# Patient Record
Sex: Male | Born: 1975 | Race: White | Hispanic: No | Marital: Married | Smoking: Never smoker
Health system: Southern US, Community
[De-identification: ages and names within clinical notes are randomized; demographics above are authoritative.]

## PROBLEM LIST (undated history)

## (undated) ENCOUNTER — Ambulatory Visit: Discharge status: Absent without leave | Payer: 59

## (undated) DIAGNOSIS — I2510 Atherosclerotic heart disease of native coronary artery without angina pectoris: Secondary | ICD-10-CM

## (undated) HISTORY — DX: Atherosclerotic heart disease of native coronary artery without angina pectoris: I25.10

---

## 2010-06-27 ENCOUNTER — Other Ambulatory Visit (HOSPITAL_COMMUNITY)
Admission: RE | Admit: 2010-06-27 | Discharge: 2010-06-27 | Disposition: A | Discharge status: Home / Self Care | Payer: BC Managed Care – PPO | Source: Ambulatory Visit | Attending: Urology | Admitting: Urology

## 2010-06-27 ENCOUNTER — Other Ambulatory Visit: Payer: Self-pay | Admitting: Urology

## 2010-06-27 DIAGNOSIS — Z302 Encounter for sterilization: Secondary | ICD-10-CM | POA: Insufficient documentation

## 2012-10-27 ENCOUNTER — Other Ambulatory Visit (HOSPITAL_COMMUNITY): Payer: Self-pay | Admitting: Urology

## 2012-10-27 DIAGNOSIS — N451 Epididymitis: Secondary | ICD-10-CM

## 2012-11-02 ENCOUNTER — Other Ambulatory Visit (HOSPITAL_COMMUNITY): Payer: BC Managed Care – PPO

## 2013-01-26 ENCOUNTER — Other Ambulatory Visit: Payer: Self-pay | Admitting: Occupational Medicine

## 2013-01-26 ENCOUNTER — Ambulatory Visit
Admission: RE | Admit: 2013-01-26 | Discharge: 2013-01-26 | Disposition: A | Discharge status: Home / Self Care | Payer: No Typology Code available for payment source | Source: Ambulatory Visit | Attending: Occupational Medicine | Admitting: Occupational Medicine

## 2013-01-26 DIAGNOSIS — Z021 Encounter for pre-employment examination: Secondary | ICD-10-CM

## 2016-11-12 ENCOUNTER — Ambulatory Visit (INDEPENDENT_AMBULATORY_CARE_PROVIDER_SITE_OTHER): Payer: 59

## 2016-11-12 ENCOUNTER — Ambulatory Visit (INDEPENDENT_AMBULATORY_CARE_PROVIDER_SITE_OTHER): Payer: 59 | Admitting: Orthopaedic Surgery

## 2016-11-12 ENCOUNTER — Encounter: Payer: Self-pay | Admitting: Orthopaedic Surgery

## 2016-11-12 VITALS — BP 126/92 | HR 73 | Temp 97.2°F | Ht 72.0 in | Wt 239.0 lb

## 2016-11-12 DIAGNOSIS — M25571 Pain in right ankle and joints of right foot: Secondary | ICD-10-CM

## 2016-11-12 MED ORDER — NAPROXEN 500 MG PO TABS: 500.0000 mg | ORAL_TABLET | Freq: Two times a day (BID) | ORAL | 5 refills | Status: DC

## 2016-11-12 NOTE — Progress Notes (Signed)
Subjective:    Patient ID: Bernard Lloyd, male    DOB: [birth year], 41 y.o.   MRN: [id number]  HPI He took a misstep and hurt his right mid foot around the medial arch about a month ago.  He iced it and stayed off it a few days.  It continues to bother him.  His wife is a physical therapist and he has tried some stretching, ice, used a ball around the foot and a night splint.  He could not tolerate the night splint.  He has no new trauma. 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  He is able to do his work but his foot hurts and bothers him after work.  It hurts when he first gets up but he gets better after walking on it.    He has no redness, no swelling, no color changes.  He has tried Advil which helps some.  He has no other joint pains.   Review of Systems  HENT: Negative for congestion.   Respiratory: Negative for cough and choking.   Cardiovascular: Negative for chest pain and leg swelling.  Endocrine: Negative for cold intolerance.  Musculoskeletal: Positive for arthralgias and gait problem.  Allergic/Immunologic: Negative for environmental allergies.   History reviewed. No pertinent past medical history.  History reviewed. No pertinent surgical history.  No current outpatient prescriptions on file prior to visit.   No current facility-administered medications on file prior to visit.     Social History   Social History  . Marital status: Married    Spouse name: N/A  . Number of children: N/A  . Years of education: N/A   Occupational History  . Not on file.   Social History Main Topics  . Smoking status: Never Smoker  . Smokeless tobacco: Never Used  . Alcohol use No  . Drug use: No  . Sexual activity: Not on file   Other Topics Concern  . Not on file   Social History Narrative  . No narrative on file    Family History  Problem Relation Age of Onset  . Diabetes Mother     BP (!) 126/92   Pulse 73   Temp (!) 97.2 F (36.2 C)   Ht 6' (1.829 m)   Wt 239  lb (108.4 kg)   BMI 32.41 kg/m       Objective:   Physical Exam  Constitutional: He is oriented to person, place, and time. He appears well-developed and well-nourished.  HENT:  Head: Normocephalic and atraumatic.  Eyes: Pupils are equal, round, and reactive to light. Conjunctivae and EOM are normal.  Neck: Normal range of motion. Neck supple.  Cardiovascular: Normal rate, regular rhythm and intact distal pulses.   Pulmonary/Chest: Effort normal.  Abdominal: Soft.  Musculoskeletal: He exhibits tenderness (Right foot inner arch has tenderness in the mid portion.  He has no swelling. He has slight pain with full dorsiextension.  He has full ROM of the toes and ankle.  Gait is normal.  NV intact.  Left foot/ankle negative.).  Neurological: He is alert and oriented to person, place, and time. He has normal reflexes. No cranial nerve deficit. He exhibits normal muscle tone. Coordination normal.  Skin: Skin is warm and dry.  Psychiatric: He has a normal mood and affect. His behavior is normal. Judgment and thought content normal.  Vitals reviewed.   X-rays were done of the left foot and ankle, reported separately.      Assessment & Plan:   Encounter  Diagnosis  Name Primary?  . Pain in joint involving right ankle and foot Yes   I have tole him x-rays were negative.  I have gone over stretching exercises.  I have recommended BioFreeze.  I have called in Naprosyn to his pharmacy.  Precautions discussed.  Return in one month.  Call if any problem.  Precautions discussed.   Electronically Signed Sanjuana Kava, MD 8/8/201811:17 AM

## 2016-12-11 ENCOUNTER — Encounter: Payer: Self-pay | Admitting: Orthopaedic Surgery

## 2016-12-11 ENCOUNTER — Ambulatory Visit (INDEPENDENT_AMBULATORY_CARE_PROVIDER_SITE_OTHER): Payer: 59 | Admitting: Orthopaedic Surgery

## 2016-12-11 VITALS — BP 115/83 | HR 70 | Temp 96.8°F | Ht 72.0 in | Wt 239.0 lb

## 2016-12-11 DIAGNOSIS — M25571 Pain in right ankle and joints of right foot: Secondary | ICD-10-CM | POA: Diagnosis not present

## 2016-12-11 NOTE — Progress Notes (Signed)
Patient Bernard Lloyd, male DOB:April 22, 1975, 41 y.o. WKG:[id number]  Chief Complaint  Patient presents with  . Foot Pain    right    HPI  Bernard Lloyd is a 41 y.o. male who has had pain in the right medial instep of the foot.  He has less pain now.  He has special inserts for his shoe that have helped.  He is walking well. HPI  Body mass index is 32.41 kg/m.  ROS  Review of Systems  HENT: Negative for congestion.   Respiratory: Negative for cough and choking.   Cardiovascular: Negative for chest pain and leg swelling.  Endocrine: Negative for cold intolerance.  Musculoskeletal: Positive for arthralgias and gait problem.  Allergic/Immunologic: Negative for environmental allergies.    No past medical history on file.  No past surgical history on file.  Family History  Problem Relation Age of Onset  . Diabetes Mother     Social History Social History  Substance Use Topics  . Smoking status: Never Smoker  . Smokeless tobacco: Never Used  . Alcohol use No    No Known Allergies  Current Outpatient Prescriptions  Medication Sig Dispense Refill  . naproxen (NAPROSYN) 500 MG tablet Take 1 tablet (500 mg total) by mouth 2 (two) times daily with a meal. 60 tablet 5   No current facility-administered medications for this visit.      Physical Exam  Blood pressure 115/83, pulse 70, temperature (!) 96.8 F (36 C), height 6' (1.829 m), weight 239 lb (108.4 kg).  Constitutional: overall normal hygiene, normal nutrition, well developed, normal grooming, normal body habitus. Assistive device:insert shoes  Musculoskeletal: gait and station Limp none, muscle tone and strength are normal, no tremors or atrophy is present.  .  Neurological: coordination overall normal.  Deep tendon reflex/nerve stretch intact.  Sensation normal.  Cranial nerves II-XII intact.   Skin:   Normal overall no scars, lesions, ulcers or rashes. No psoriasis.  Psychiatric: Alert and oriented x 3.   Recent memory intact, remote memory unclear.  Normal mood and affect. Well groomed.  Good eye contact.  Cardiovascular: overall no swelling, no varicosities, no edema bilaterally, normal temperatures of the legs and arms, no clubbing, cyanosis and good capillary refill.  Lymphatic: palpation is normal.  The right foot is not tender.  He is walking well.  Exam negative.  The patient has been educated about the nature of the problem(s) and counseled on treatment options.  The patient appeared to understand what I have discussed and is in agreement with it.  Encounter Diagnosis  Name Primary?  . Pain in joint involving right ankle and foot Yes    PLAN Call if any problems.  Precautions discussed.  Continue current medications.   Return to clinic PRN   Electronically Signed Sanjuana Kava, MD 9/6/201810:42 AM

## 2018-02-17 LAB — GLUCOSE, POCT (MANUAL RESULT ENTRY): POC GLUCOSE: 107 mg/dL — AB (ref 70–99)

## 2020-03-02 ENCOUNTER — Ambulatory Visit
Admission: RE | Admit: 2020-03-02 | Discharge: 2020-03-02 | Disposition: A | Discharge status: Home / Self Care | Payer: 59 | Source: Ambulatory Visit | Attending: Family Medicine | Admitting: Family Medicine

## 2020-03-02 ENCOUNTER — Other Ambulatory Visit: Payer: Self-pay

## 2020-03-02 VITALS — BP 140/99 | HR 104 | Temp 98.7°F | Resp 18

## 2020-03-02 DIAGNOSIS — R000 Tachycardia, unspecified: Secondary | ICD-10-CM

## 2020-03-02 DIAGNOSIS — J069 Acute upper respiratory infection, unspecified: Secondary | ICD-10-CM

## 2020-03-02 DIAGNOSIS — R519 Headache, unspecified: Secondary | ICD-10-CM | POA: Diagnosis present

## 2020-03-02 DIAGNOSIS — R6883 Chills (without fever): Secondary | ICD-10-CM | POA: Diagnosis not present

## 2020-03-02 DIAGNOSIS — K122 Cellulitis and abscess of mouth: Secondary | ICD-10-CM

## 2020-03-02 DIAGNOSIS — J029 Acute pharyngitis, unspecified: Secondary | ICD-10-CM | POA: Diagnosis not present

## 2020-03-02 LAB — POCT RAPID STREP A (OFFICE): Rapid Strep A Screen: NEGATIVE

## 2020-03-02 MED ORDER — AZITHROMYCIN 250 MG PO TABS: 250.0000 mg | ORAL_TABLET | Freq: Every day | ORAL | 0 refills | Status: DC

## 2020-03-02 MED ORDER — PREDNISONE 10 MG (21) PO TBPK: ORAL_TABLET | Freq: Every day | ORAL | 0 refills | Status: AC

## 2020-03-02 NOTE — ED Triage Notes (Addendum)
Pt is here with a swollen tonsilis that started 2 days ago, pt has taken OTC meds to relieve discomfort.

## 2020-03-02 NOTE — ED Provider Notes (Signed)
[facility]   [id number] 03/02/20 Arrival Time: 8280  [id number] THROAT  SUBJECTIVE: History from: patient.  Estle Huguley is a 44 y.o. male who presents with abrupt onset of sore throat, sinus pain, lymphadenopathy, fatigue, for the last 2 days. 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 Has tried OTC cough and cold without relief. Has negative history of Covid. Has completed Covid vaccines. Had negative Covid test yesterday. Symptoms are made worse with swallowing, but tolerating liquids and own secretions without difficulty.  Denies previous symptoms in the past. Declines Covid testing today.  Denies fever, chills, ear pain, rhinorrhea, SOB, wheezing, chest pain, nausea, rash, changes in bowel or bladder habits.    ROS: As per HPI.  All other pertinent ROS negative.     History reviewed. No pertinent past medical history. History reviewed. No pertinent surgical history. No Known Allergies No current facility-administered medications on file prior to encounter.   Current Outpatient Medications on File Prior to Encounter  Medication Sig Dispense Refill  . finasteride (PROPECIA) 1 MG tablet Take by mouth.    . phentermine (ADIPEX-P) 37.5 MG tablet TAKE 1 TABLET BY MOUTH 30 MINUTES BEFORE BREAKFAST    . rosuvastatin (CRESTOR) 10 MG tablet Take by mouth.    . valACYclovir (VALTREX) 1000 MG tablet Take one tablet daily as needed.    . naproxen (NAPROSYN) 500 MG tablet Take 1 tablet (500 mg total) by mouth 2 (two) times daily with a meal. 60 tablet 5   Social History   Socioeconomic History  . Marital status: Married    Spouse name: Not on file  . Number of children: Not on file  . Years of education: Not on file  . Highest education level: Not on file  Occupational History  . Not on file  Tobacco Use  . Smoking status: Never Smoker  . Smokeless tobacco: Never Used  Substance and Sexual Activity  . Alcohol use: Yes  . Drug use: No  . Sexual  activity: Yes    Birth control/protection: None    Comment: Married  Other Topics Concern  . Not on file  Social History Narrative  . Not on file   Social Determinants of Health   Financial Resource Strain:   . Difficulty of Paying Living Expenses: Not on file  Food Insecurity:   . Worried About Charity fundraiser in the Last Year: Not on file  . Ran Out of Food in the Last Year: Not on file  Transportation Needs:   . Lack of Transportation (Medical): Not on file  . Lack of Transportation (Non-Medical): Not on file  Physical Activity:   . Days of Exercise per Week: Not on file  . Minutes of Exercise per Session: Not on file  Stress:   . Feeling of Stress : Not on file  Social Connections:   . Frequency of Communication with Friends and Family: Not on file  . Frequency of Social Gatherings with Friends and Family: Not on file  . Attends Religious Services: Not on file  . Active Member of Clubs or Organizations: Not on file  . Attends Archivist Meetings: Not on file  . Marital Status: Not on file  Intimate Partner Violence:   . Fear of Current or Ex-Partner: Not on file  . Emotionally Abused: Not on file  . Physically Abused: Not on file  . Sexually Abused: Not on file   Family History  Problem Relation Age of  Onset  . Diabetes Mother   . Healthy Father     OBJECTIVE:  Vitals:   03/02/20 1021  BP: (!) 140/99  Pulse: (!) 104  Resp: 18  Temp: 98.7 F (37.1 C)  TempSrc: Oral  SpO2: 96%     General appearance: alert; appears fatigued, but nontoxic, speaking in full sentences and managing own secretions HEENT: NCAT; Ears: EACs clear, TMs pearly gray with visible cone of light, without erythema; Eyes: PERRL, EOMI grossly; Nose: no obvious rhinorrhea; Throat: oropharynx erythematous, cobblestoning present, tonsils 1+ and mildly erythematous without white tonsillar exudates, uvula midline, erythematous, enlarged, sinuses: Frontal and maxillary sinus  tenderness present Neck: supple with LAD Lungs: CTA; cough absent Heart: regular rate and rhythm.  Radial pulses 2+ symmetrical bilaterally Skin: warm and dry Psychological: alert and cooperative; normal mood and affect  LABS: No results found for this or any previous visit (from the past 24 hour(s)).   ASSESSMENT & PLAN:  1. Acute upper respiratory infection   2. Sore throat   3. Uvulitis   4. Chills   5. Tachycardia   6. Nonintractable headache, unspecified chronicity pattern, unspecified headache type     Meds ordered this encounter  Medications  . azithromycin (ZITHROMAX) 250 MG tablet    Sig: Take 1 tablet (250 mg total) by mouth daily. Take first 2 tablets together, then 1 every day until finished.    Dispense:  6 tablet    Refill:  0    Order Specific Question:   Supervising Provider    Answer:   Chase Picket [id number]  . predniSONE (STERAPRED UNI-PAK 21 TAB) 10 MG (21) TBPK tablet    Sig: Take by mouth daily for 6 days. Take 6 tablets on day 1, 5 tablets on day 2, 4 tablets on day 3, 3 tablets on day 4, 2 tablets on day 5, 1 tablet on day 6    Dispense:  21 tablet    Refill:  0    Order Specific Question:   Supervising Provider    Answer:   Chase Picket [[id number]]    We will treat for URI Prescribed azithromycin Prescribed steroid taper Strep test negative, will send out for culture and we will call you with results Declines test for mono at this time Get plenty of rest and push fluids Take OTC Zyrtec and use chloraseptic spray as needed for throat pain. Drink warm or cool liquids, use throat lozenges, or popsicles to help alleviate symptoms Take OTC ibuprofen or tylenol as needed for pain Follow up with PCP if symptoms persists Return or go to ER if patient has any new or worsening symptoms such as fever, chills, nausea, vomiting, worsening sore throat, cough, abdominal pain, chest pain, changes in bowel or bladder habits  Reviewed expectations re:  course of current medical issues. Questions answered. Outlined signs and symptoms indicating need for more acute intervention. Patient verbalized understanding. After Visit Summary given.          Faustino Congress, NP 03/04/20 1025

## 2020-03-02 NOTE — Discharge Instructions (Signed)
Your rapid strep test is negative.  A throat culture is pending; we will call you if it is positive requiring treatment.    I have sent in azithromycin for you to take. Take 2 tablets today, then one tablet daily for the next 4 days.  I have sent in a prednisone taper for you to take for 6 days. 6 tablets on day one, 5 tablets on day two, 4 tablets on day three, 3 tablets on day four, 2 tablets on day five, and 1 tablet on day six.  Follow up with this office or with primary care if symptoms are persisting.  Follow up in the ER for high fever, trouble swallowing, trouble breathing, other concerning symptoms.

## 2020-03-05 LAB — CULTURE, GROUP A STREP (THRC)

## 2020-07-25 LAB — EXTERNAL GENERIC LAB PROCEDURE: COLOGUARD: NEGATIVE

## 2020-07-25 LAB — COLOGUARD: COLOGUARD: NEGATIVE

## 2021-05-05 ENCOUNTER — Ambulatory Visit: Payer: 59

## 2021-05-05 ENCOUNTER — Ambulatory Visit
Admission: EM | Admit: 2021-05-05 | Discharge: 2021-05-05 | Disposition: A | Discharge status: Home / Self Care | Payer: 59 | Attending: Student | Admitting: Student

## 2021-05-05 ENCOUNTER — Ambulatory Visit (INDEPENDENT_AMBULATORY_CARE_PROVIDER_SITE_OTHER): Payer: 59

## 2021-05-05 ENCOUNTER — Other Ambulatory Visit: Payer: Self-pay

## 2021-05-05 DIAGNOSIS — R042 Hemoptysis: Secondary | ICD-10-CM | POA: Diagnosis not present

## 2021-05-05 DIAGNOSIS — J0100 Acute maxillary sinusitis, unspecified: Secondary | ICD-10-CM | POA: Diagnosis not present

## 2021-05-05 DIAGNOSIS — R059 Cough, unspecified: Secondary | ICD-10-CM

## 2021-05-05 DIAGNOSIS — U071 COVID-19: Secondary | ICD-10-CM | POA: Diagnosis not present

## 2021-05-05 MED ORDER — AMOXICILLIN-POT CLAVULANATE 875-125 MG PO TABS: 1.0000 | ORAL_TABLET | Freq: Two times a day (BID) | ORAL | 0 refills | Status: AC

## 2021-05-05 NOTE — ED Provider Notes (Addendum)
[facility]    CSN: [id number] Arrival date & time: 05/05/21  1638      History   Chief Complaint Chief Complaint  Patient presents with   sinus issues    Drainage and sinus issues    HPI Bernard Lloyd is a 46 y.o. male presenting with cough and  congestion following COVID-19 x10 days.  Medical history noncontributory, denies history of cardiopulmonary disease, he has never smoked.  States he was actually feeling better following a telehealth visit / paxlovid, but then started feeling bad again 2 days ago.  Is now coughing up blood and mucus and throat is sore.  Increasingly purulent nasal congestion and facial pain.  States that he is coughing up sputum that is yellow, and there is only trace blood in it.  Denies shortness of breath or chest pain.  Denies dizziness, weakness. Denies recent travel, prolonged immobilization, recent surgery, recent trauma, HRT use, history of clots, history of DVT, history of PE, smoking. Has not taken any medications today.   HPI  History reviewed. No pertinent past medical history.  There are no problems to display for this patient.   History reviewed. No pertinent surgical history.     Home Medications    Prior to Admission medications   Medication Sig Start Date End Date Taking? Authorizing Provider  amoxicillin-clavulanate (AUGMENTIN) 875-125 MG tablet Take 1 tablet by mouth every 12 (twelve) hours. 05/05/21  Yes Hazel Sams, PA-C  finasteride (PROPECIA) 1 MG tablet Take by mouth. 11/17/14   [provider]  phentermine (ADIPEX-P) 37.5 MG tablet TAKE 1 TABLET BY MOUTH 30 MINUTES BEFORE BREAKFAST 12/20/19   [provider]  rosuvastatin (CRESTOR) 10 MG tablet Take by mouth. 12/22/19   [provider]  valACYclovir (VALTREX) 1000 MG tablet Take one tablet daily as needed. 09/15/13   [provider]    Family History Family History  Problem Relation Age of Onset   Diabetes Mother     Healthy Father     Social History Social History   Tobacco Use   Smoking status: Never   Smokeless tobacco: Never  Vaping Use   Vaping Use: Never used  Substance Use Topics   Alcohol use: Yes    Comment: Occas   Drug use: No     Allergies   Patient has no known allergies.   Review of Systems Review of Systems  Constitutional:  Negative for appetite change, chills and fever.  HENT:  Positive for congestion and sinus pain. Negative for ear pain, rhinorrhea, sinus pressure and sore throat.   Eyes:  Negative for redness and visual disturbance.  Respiratory:  Positive for cough. Negative for chest tightness, shortness of breath and wheezing.   Cardiovascular:  Negative for chest pain and palpitations.  Gastrointestinal:  Negative for abdominal pain, constipation, diarrhea, nausea and vomiting.  Genitourinary:  Negative for dysuria, frequency and urgency.  Musculoskeletal:  Negative for myalgias.  Neurological:  Negative for dizziness, weakness and headaches.  Psychiatric/Behavioral:  Negative for confusion.   All other systems reviewed and are negative.   Physical Exam Triage Vital Signs ED Triage Vitals [05/05/21 0841]  Enc Vitals Group     BP      Pulse      Resp      Temp      Temp src      SpO2      Weight      Height      Head Circumference  Peak Flow      Pain Score 7     Pain Loc      Pain Edu?      Excl. in [place]?    No data found.  Updated Vital Signs BP 136/86 (BP Location: Right Arm)    Pulse (!) 118    Temp 99.2 F (37.3 C) (Oral)    Resp 18    SpO2 95%   Visual Acuity Right Eye Distance:   Left Eye Distance:   Bilateral Distance:    Right Eye Near:   Left Eye Near:    Bilateral Near:     Physical Exam Vitals reviewed.  Constitutional:      General: He is not in acute distress.    Appearance: Normal appearance. He is not ill-appearing.  HENT:     Head: Normocephalic and atraumatic.     Right Ear: Tympanic membrane, ear canal and  external ear normal. No tenderness. No middle ear effusion. There is no impacted cerumen. Tympanic membrane is not perforated, erythematous, retracted or bulging.     Left Ear: Tympanic membrane, ear canal and external ear normal. No tenderness.  No middle ear effusion. There is no impacted cerumen. Tympanic membrane is not perforated, erythematous, retracted or bulging.     Nose: No congestion.     Right Sinus: Maxillary sinus tenderness present. No frontal sinus tenderness.     Left Sinus: Maxillary sinus tenderness present. No frontal sinus tenderness.     Mouth/Throat:     Mouth: Mucous membranes are moist.     Pharynx: Uvula midline. Posterior oropharyngeal erythema present. No oropharyngeal exudate.     Comments: Posterior pharyngeal erythema and cobblestoning. No tonsillar or uvula hypertrophy.  Eyes:     Extraocular Movements: Extraocular movements intact.     Pupils: Pupils are equal, round, and reactive to light.  Cardiovascular:     Rate and Rhythm: Regular rhythm. Tachycardia present.     Heart sounds: Normal heart sounds.     Comments: Calves are equal and symmetric. Negative homan sign. No venous distension. Pulmonary:     Effort: Pulmonary effort is normal.     Breath sounds: Normal breath sounds. No decreased breath sounds, wheezing, rhonchi or rales.     Comments: Breath sounds clear throughout  Abdominal:     Palpations: Abdomen is soft.     Tenderness: There is no abdominal tenderness. There is no guarding or rebound.  Lymphadenopathy:     Cervical: No cervical adenopathy.     Right cervical: No superficial cervical adenopathy.    Left cervical: No superficial cervical adenopathy.  Neurological:     General: No focal deficit present.     Mental Status: He is alert and oriented to person, place, and time.  Psychiatric:        Mood and Affect: Mood normal.        Behavior: Behavior normal.        Thought Content: Thought content normal.        Judgment: Judgment  normal.     UC Treatments / Results  Labs (all labs ordered are listed, but only abnormal results are displayed) Labs Reviewed - No data to display  EKG   Radiology DG Chest 2 View  Result Date: 05/05/2021 CLINICAL DATA:  Hemoptysis and worsening cough EXAM: CHEST - 2 VIEW COMPARISON:  01/26/2013 FINDINGS: Normal heart size and mediastinal contours. No acute infiltrate or edema. No effusion or pneumothorax. No acute osseous findings. IMPRESSION: No active  cardiopulmonary disease. Electronically Signed   By: Jorje Guild M.D.   On: 05/05/2021 09:10    Procedures Procedures (including critical care time)  Medications Ordered in UC Medications - No data to display  Initial Impression / Assessment and Plan / UC Course  I have reviewed the triage vital signs and the nursing notes.  Pertinent labs & imaging results that were available during my care of the patient were reviewed by me and considered in my medical decision making (see chart for details).     This patient is a very pleasant 46 y.o. year old male presenting with sinusitis. He is tachy at 118 but afebrile.  There is trace hemoptysis. CXR - No active cardiopulmonary disease.Marland Kitchen He does not have a history of pulmonary disease.  Wells score for PE is 2.5.  He has never had a DVT or PE before, he is not on hormone replacement, never smoker.  There is only trace blood in his sputum.  Suspect this is related to sinusitis and postnasal drip rather than true hemoptysis; but cannot completely rule out PE at urgent care.  Discussed DDx with patient, he adamantly prefers antibiotic management versus ED referral.  States that he will go to the emergency department if symptoms get worse at all.  He understands that a PE is a medical emergency, and that I cannot completely rule this out in the urgent care setting, and then going home without this evaluation could be dangerous. ED return precautions discussed. Patient verbalizes understanding  and agreement. Coding Level 4 for acute illness with systemic symptoms, and prescription drug management.   Final Clinical Impressions(s) / UC Diagnoses   Final diagnoses:  Acute non-recurrent maxillary sinusitis  COVID-19  Hemoptysis     Discharge Instructions      -I think that you have a sinus infection that is causing the fast heart rate and blood in your sputum. -There is a small chance that you have something called a pulmonary embolism, this can also cause a fast heart rate and blood in your sputum.  If your symptoms get worse, like shortness of breath, chest pain, you are coughing up more blood-head straight to the emergency department.  This would be a medical emergency.     ED Prescriptions     Medication Sig Dispense Auth. Provider   amoxicillin-clavulanate (AUGMENTIN) 875-125 MG tablet Take 1 tablet by mouth every 12 (twelve) hours. 14 tablet Hazel Sams, PA-C      PDMP not reviewed this encounter.   Hazel Sams, PA-C 05/05/21 0935    Hazel Sams, PA-C 05/05/21 1756

## 2021-05-05 NOTE — ED Triage Notes (Signed)
Patient states about 10 days ago he tested positive for Covid  Patient states he did a Tele-Visit and they prescribed him some Meds and started feeling better  Patient states he started feeling bad again on Friday 2 days ago.  Patient states he is coughing up blood and mucus and his throat is sore  Patient states his lungs still feel full and his head is stuffed up   Patient states that his tonsils are swollen

## 2021-05-05 NOTE — Discharge Instructions (Addendum)
-  I think that you have a sinus infection that is causing the fast heart rate and blood in your sputum. -There is a small chance that you have something called a pulmonary embolism, this can also cause a fast heart rate and blood in your sputum.  If your symptoms get worse, like shortness of breath, chest pain, you are coughing up more blood-head straight to the emergency department.  This would be a medical emergency.

## 2022-09-24 IMAGING — DX DG CHEST 2V
2 series · 2 of 2 positions shown · non-contrast
Comparison: 01/26/2013

CLINICAL DATA: Hemoptysis and worsening cough

EXAM:
CHEST - 2 VIEW

[chest pa]
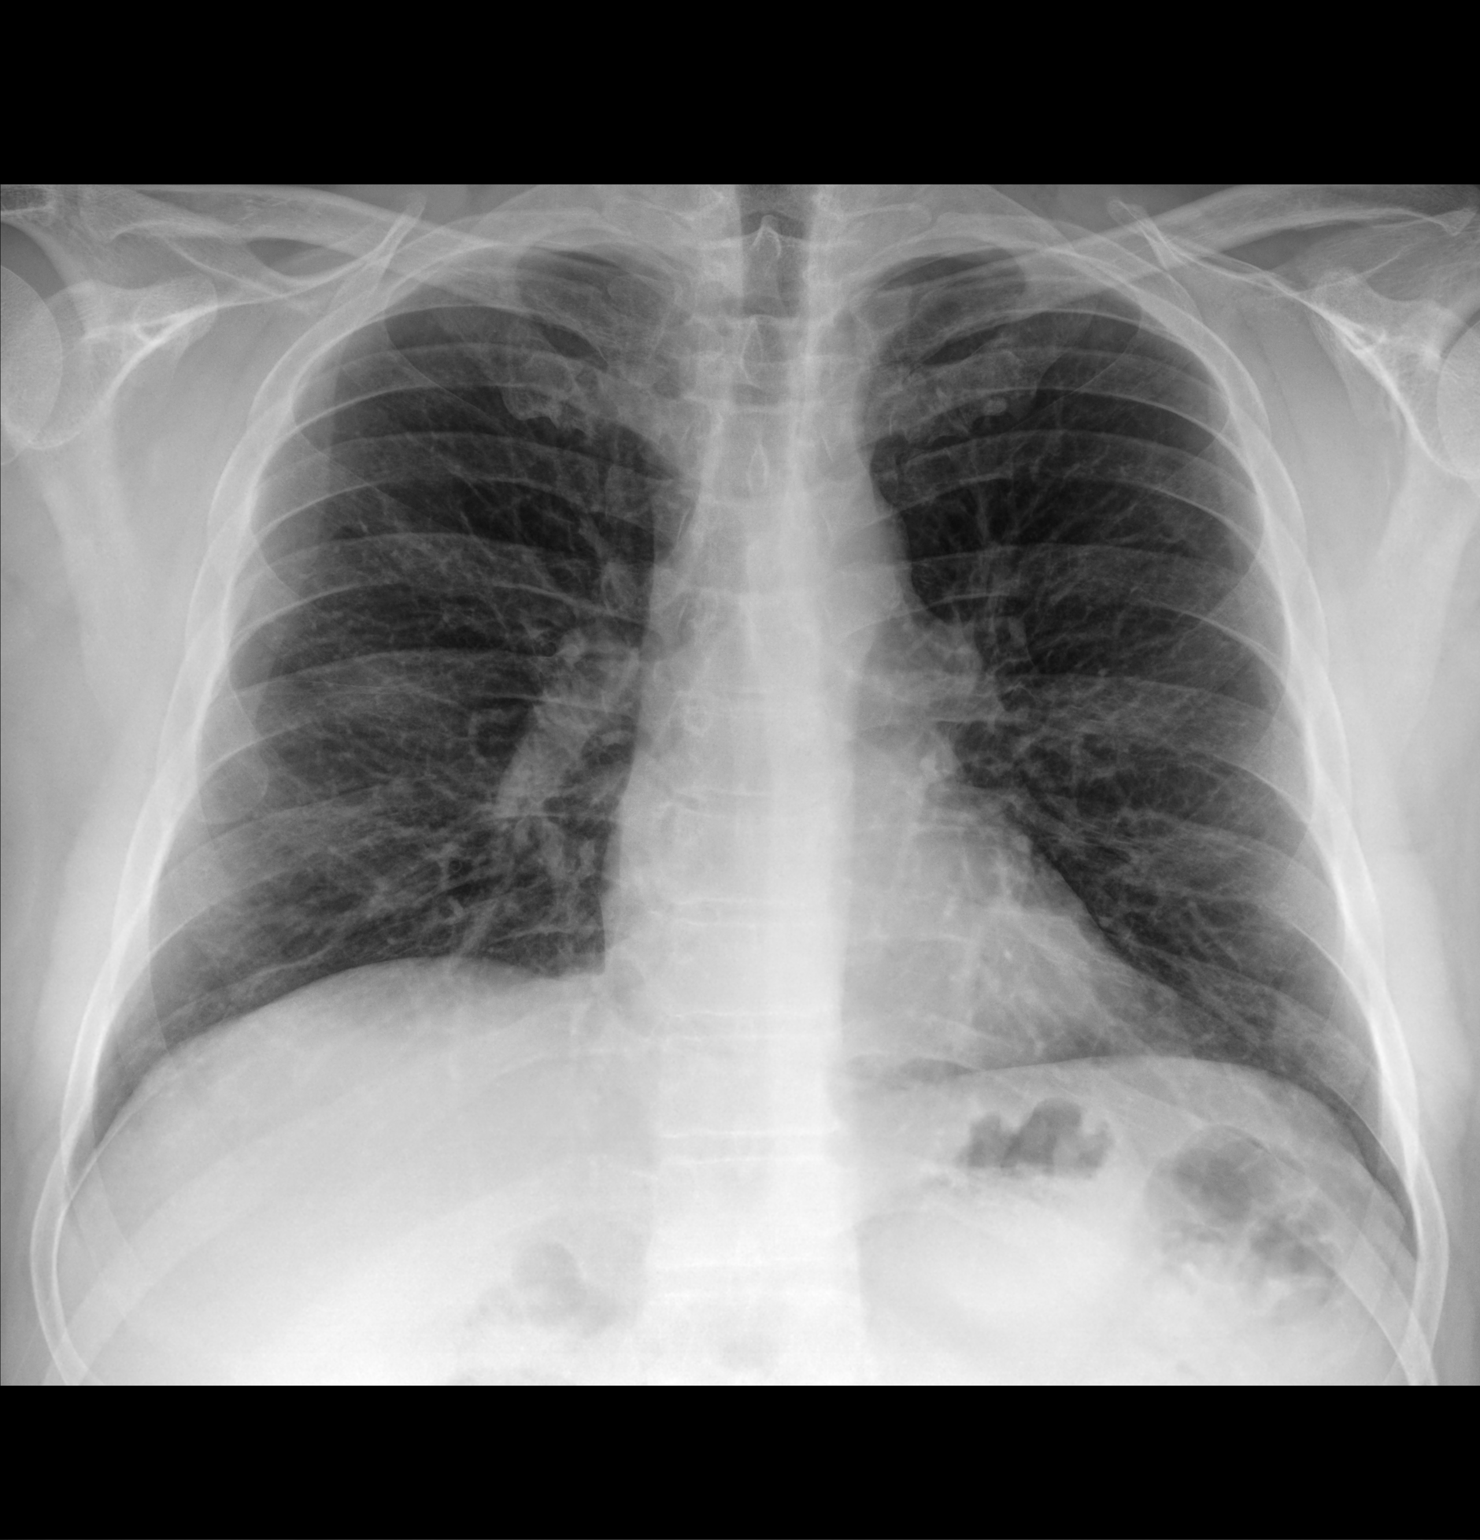

[chest lat]
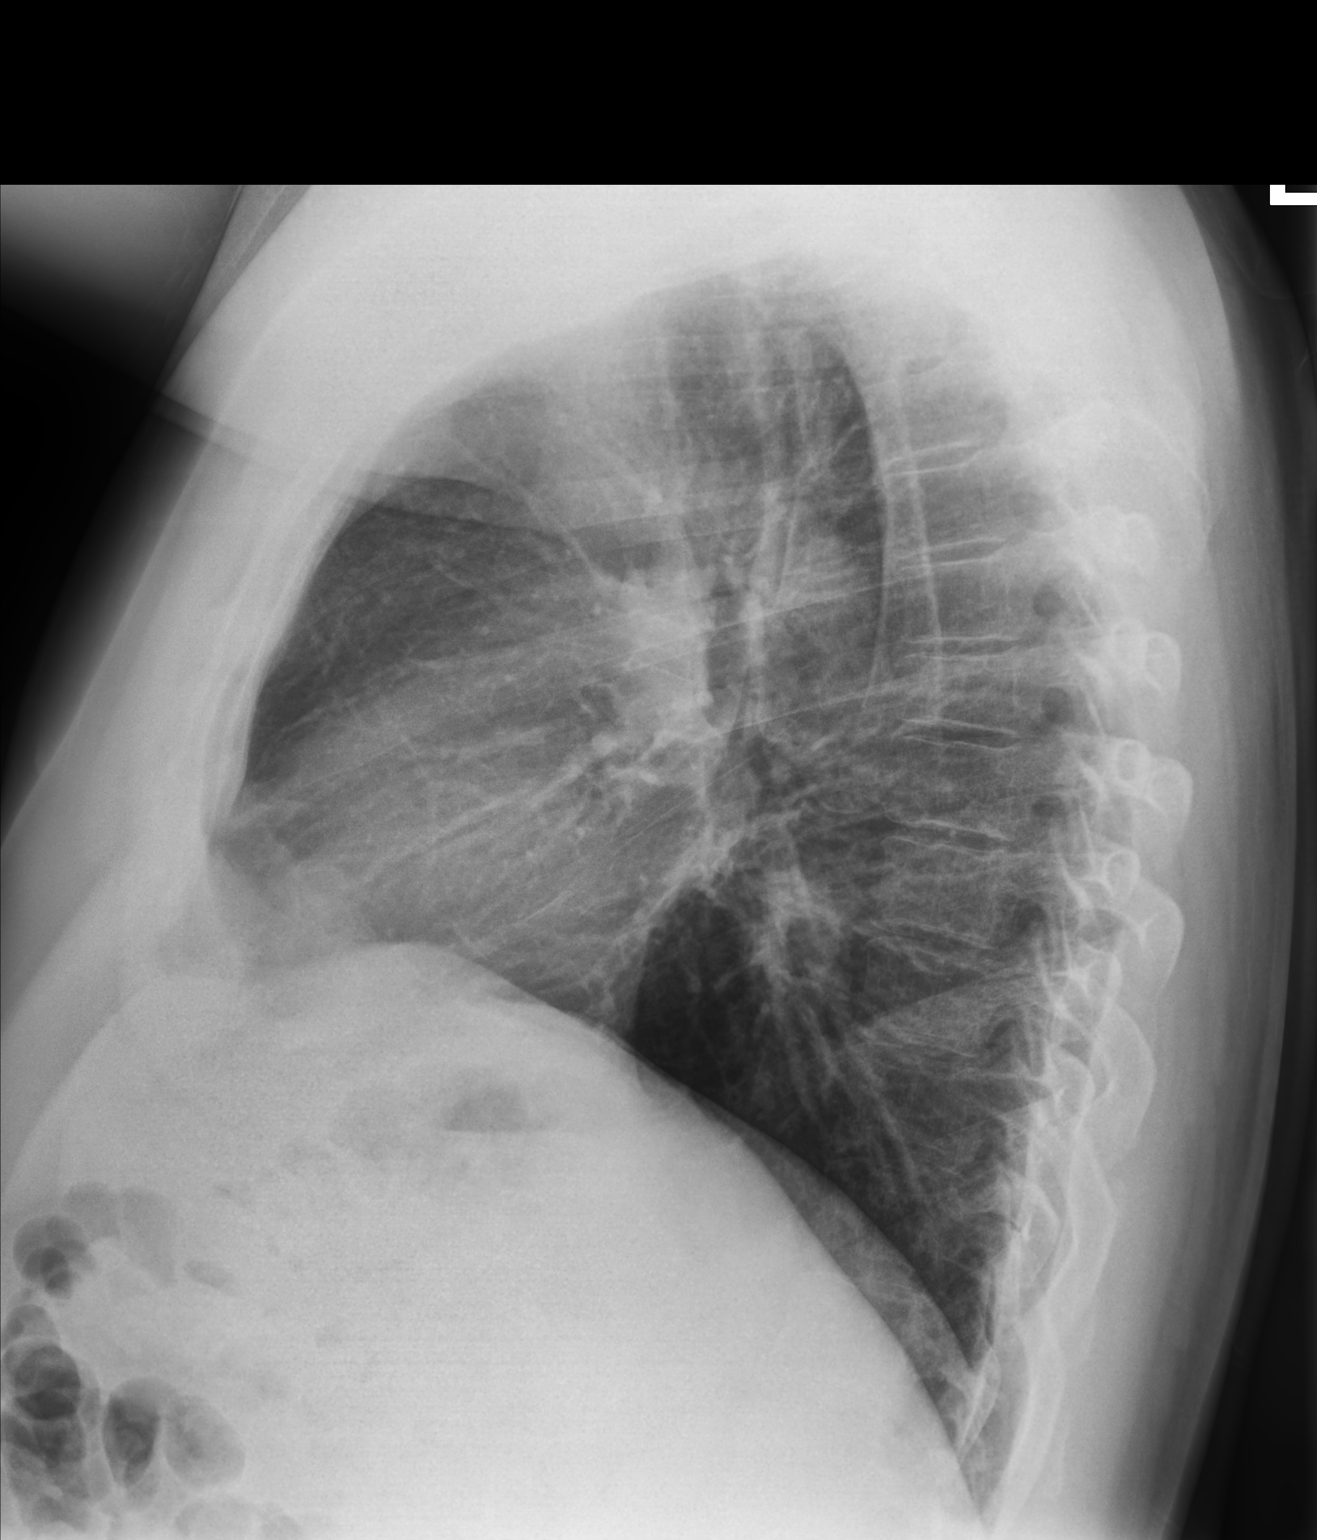

[2 of 2 positions shown; findings below may reference images not displayed]

FINDINGS: Normal heart size and mediastinal contours. No acute infiltrate or
edema. No effusion or pneumothorax. No acute osseous findings.
IMPRESSION: No active cardiopulmonary disease.

## 2023-04-20 ENCOUNTER — Ambulatory Visit
Admission: RE | Admit: 2023-04-20 | Discharge: 2023-04-20 | Disposition: A | Discharge status: Home / Self Care | Payer: No Typology Code available for payment source | Source: Ambulatory Visit | Attending: Nurse Practitioner | Admitting: Nurse Practitioner

## 2023-04-20 ENCOUNTER — Other Ambulatory Visit: Payer: Self-pay | Admitting: Nurse Practitioner

## 2023-04-20 DIAGNOSIS — S199XXA Unspecified injury of neck, initial encounter: Secondary | ICD-10-CM

## 2023-08-11 LAB — COLOGUARD: COLOGUARD: NEGATIVE

## 2023-08-25 ENCOUNTER — Other Ambulatory Visit: Payer: Self-pay

## 2023-08-25 ENCOUNTER — Ambulatory Visit
Admission: EM | Admit: 2023-08-25 | Discharge: 2023-08-25 | Disposition: A | Discharge status: Home / Self Care | Attending: Nurse Practitioner | Admitting: Nurse Practitioner

## 2023-08-25 DIAGNOSIS — F439 Reaction to severe stress, unspecified: Secondary | ICD-10-CM | POA: Diagnosis not present

## 2023-08-25 DIAGNOSIS — M542 Cervicalgia: Secondary | ICD-10-CM

## 2023-08-25 DIAGNOSIS — M25512 Pain in left shoulder: Secondary | ICD-10-CM | POA: Diagnosis not present

## 2023-08-25 MED ORDER — DEXAMETHASONE SODIUM PHOSPHATE 10 MG/ML IJ SOLN
10.0000 mg | INTRAMUSCULAR | Status: AC
  Administered 2023-08-25: 10 mg via INTRAMUSCULAR

## 2023-08-25 MED ORDER — TIZANIDINE HCL 4 MG PO TABS: 4.0000 mg | ORAL_TABLET | Freq: Four times a day (QID) | ORAL | 0 refills | Status: AC | PRN

## 2023-08-25 MED ORDER — PREDNISONE 20 MG PO TABS: 40.0000 mg | ORAL_TABLET | Freq: Every day | ORAL | 0 refills | Status: AC

## 2023-08-25 MED ORDER — KETOROLAC TROMETHAMINE 30 MG/ML IJ SOLN
30.0000 mg | Freq: Once | INTRAMUSCULAR | Status: AC
  Administered 2023-08-25: 30 mg via INTRAMUSCULAR

## 2023-08-25 NOTE — Discharge Instructions (Addendum)
 Your EKG was normal. You were given injections of Toradol 30 mg and Decadron 10 mg.  Start the prednisone  on 08/26/2023. May take over-the-counter Tylenol as needed for breakthrough pain or discomfort.  Do not take any additional ibuprofen while you are taking the prednisone .  You should take Tylenol until you have completed the prednisone . Recommend the use of ice or heat.  Apply ice for pain or swelling, heat for spasm or stiffness.  Apply for 20 minutes, remove for 1 hour, repeat is much as possible while symptoms persist. Gentle range of motion exercises while symptoms persist. Go to the emergency department immediately if you experience worsening left shoulder pain/chest pain, develop difficulty breathing, nausea, vomiting, or other concerns. As discussed, if symptoms fail to improve, please follow-up with your primary care physician for further evaluation. Follow-up as needed.

## 2023-08-25 NOTE — ED Provider Notes (Signed)
 [facility]    CSN: [id number] Arrival date & time: 08/25/23  1527      History   Chief Complaint No chief complaint on file.   HPI Bernard Lloyd is a 48 y.o. male.   The history is provided by the patient.   Patient presents with a 1 day history of left shoulder/chest pain.  Patient states pain radiates into the left side of his neck, pain worsens with coughing and deep breathing.  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  States that it is difficult for him to lying that side at nighttime during sleep.  He denies injury, trauma, numbness, tingling, decreased range of motion, or left extremity weakness.  Patient states that he has not taken any medication for his symptoms.  Patient reports that he recently lost his father and is under an increased amount of stress.   History reviewed. No pertinent past medical history.  There are no active problems to display for this patient.   History reviewed. No pertinent surgical history.     Home Medications    Prior to Admission medications   Medication Sig Start Date End Date Taking? Authorizing Provider  predniSONE  (DELTASONE ) 20 MG tablet Take 2 tablets (40 mg total) by mouth daily with breakfast for 5 days. 08/25/23 08/30/23 Yes Leath-Warren, Belen Bowers, NP  tiZANidine (ZANAFLEX) 4 MG tablet Take 1 tablet (4 mg total) by mouth every 6 (six) hours as needed for muscle spasms. 08/25/23  Yes Leath-Warren, Belen Bowers, NP  amoxicillin -clavulanate (AUGMENTIN ) 875-125 MG tablet Take 1 tablet by mouth every 12 (twelve) hours. 05/05/21   Graham, Laura E, PA-C  finasteride (PROPECIA) 1 MG tablet Take by mouth. 11/17/14   [provider]  phentermine (ADIPEX-P) 37.5 MG tablet TAKE 1 TABLET BY MOUTH 30 MINUTES BEFORE BREAKFAST 12/20/19   [provider]  rosuvastatin (CRESTOR) 10 MG tablet Take by mouth. 12/22/19   [provider]  valACYclovir  (VALTREX) 1000 MG tablet Take one tablet daily as needed. 09/15/13   [provider]    Family History Family History  Problem Relation Age of Onset   Diabetes Mother    Healthy Father     Social History Social History   Tobacco Use   Smoking status: Never   Smokeless tobacco: Never  Vaping Use   Vaping status: Never Used  Substance Use Topics   Alcohol use: Yes    Comment: Occas   Drug use: No     Allergies   Patient has no known allergies.   Review of Systems Review of Systems Per HPI  Physical Exam Triage Vital Signs ED Triage Vitals  Encounter Vitals Group     BP 08/25/23 1558 135/87     Systolic BP Percentile --      Diastolic BP Percentile --      Pulse Rate 08/25/23 1558 79     Resp 08/25/23 1558 18     Temp 08/25/23 1558 97.7 F (36.5 C)     Temp Source 08/25/23 1558 Oral     SpO2 08/25/23 1558 96 %     Weight --      Height --      Head Circumference --      Peak Flow --      Pain Score 08/25/23 1601 7     Pain Loc --      Pain Education --  Exclude from Growth Chart --    No data found.  Updated Vital Signs BP 135/87 (BP Location: Right Arm)   Pulse 79   Temp 97.7 F (36.5 C) (Oral)   Resp 18   SpO2 96%   Visual Acuity Right Eye Distance:   Left Eye Distance:   Bilateral Distance:    Right Eye Near:   Left Eye Near:    Bilateral Near:     Physical Exam Vitals and nursing note reviewed.  Constitutional:      General: He is not in acute distress.    Appearance: Normal appearance.  HENT:     Head: Normocephalic.  Eyes:     Extraocular Movements: Extraocular movements intact.     Pupils: Pupils are equal, round, and reactive to light.  Cardiovascular:     Rate and Rhythm: Normal rate and regular rhythm.     Pulses: Normal pulses.     Heart sounds: Normal heart sounds.  Pulmonary:     Effort: Pulmonary effort is normal. No respiratory distress.     Breath sounds: Normal breath sounds. No stridor. No wheezing,  rhonchi or rales.  Abdominal:     General: Bowel sounds are normal.     Palpations: Abdomen is soft.     Tenderness: There is no abdominal tenderness.  Musculoskeletal:     Left shoulder: Tenderness present. No swelling, deformity or effusion. Normal range of motion. Normal strength. Normal pulse.       Arms:     Cervical back: Normal range of motion. No erythema or signs of trauma. Muscular tenderness present. Normal range of motion.  Lymphadenopathy:     Cervical: No cervical adenopathy.  Skin:    General: Skin is warm and dry.  Neurological:     General: No focal deficit present.     Mental Status: He is alert and oriented to person, place, and time.  Psychiatric:        Mood and Affect: Mood normal.        Behavior: Behavior normal.      UC Treatments / Results  Labs (all labs ordered are listed, but only abnormal results are displayed) Labs Reviewed - No data to display  EKG: Normal sinus rhythm, no ectopy.  No STEMI.  No other comparisons available.   Radiology No results found.  Procedures Procedures (including critical care time)  Medications Ordered in UC Medications  dexamethasone (DECADRON) injection 10 mg (10 mg Intramuscular Given 08/25/23 1641)  ketorolac (TORADOL) 30 MG/ML injection 30 mg (30 mg Intramuscular Given 08/25/23 1642)    Initial Impression / Assessment and Plan / UC Course  I have reviewed the triage vital signs and the nursing notes.  Pertinent labs & imaging results that were available during my care of the patient were reviewed by me and considered in my medical decision making (see chart for details).  EKG shows normal sinus rhythm, no ectopy, no STEMI.  Patient with left shoulder pain that radiates into the left chest and left neck, pain worsens with coughing and deep breathing.  No injury or trauma per patient report.  Difficult to determine the cause of the patient's pain at this time, he does report that he recently lost his father and  has been under increased stress.  Symptoms most likely exacerbated by recent inflammation.  Toradol 30 mg IM and Decadron 10 mg IM administered for pain and inflammation.  Will start patient on prednisone  40 mg for the next 5 days, along  with tizanidine 4 mg for muscle skeletal pain.  Supportive care recommendations were provided and discussed with the patient to include fluids, rest, over-the-counter analgesics, and range of motion exercises.  Discussed indications with patient regarding ER follow-up versus follow-up with PCP.  Patient was in agreement with this plan of care and verbalizes understanding.  All questions were answered.  Patient stable for discharge.  Final Clinical Impressions(s) / UC Diagnoses   Final diagnoses:  Left shoulder pain, unspecified chronicity  Neck pain on left side  Stress     Discharge Instructions      Your EKG was normal. You were given injections of Toradol 30 mg and Decadron 10 mg.  Start the prednisone  on 08/26/2023. May take over-the-counter Tylenol as needed for breakthrough pain or discomfort.  Do not take any additional ibuprofen while you are taking the prednisone .  You should take Tylenol until you have completed the prednisone . Recommend the use of ice or heat.  Apply ice for pain or swelling, heat for spasm or stiffness.  Apply for 20 minutes, remove for 1 hour, repeat is much as possible while symptoms persist. Gentle range of motion exercises while symptoms persist. Go to the emergency department immediately if you experience worsening left shoulder pain/chest pain, develop difficulty breathing, nausea, vomiting, or other concerns. As discussed, if symptoms fail to improve, please follow-up with your primary care physician for further evaluation. Follow-up as needed.   ED Prescriptions     Medication Sig Dispense Auth. Provider   predniSONE  (DELTASONE ) 20 MG tablet Take 2 tablets (40 mg total) by mouth daily with breakfast for 5 days. 10 tablet  Leath-Warren, Belen Bowers, NP   tiZANidine (ZANAFLEX) 4 MG tablet Take 1 tablet (4 mg total) by mouth every 6 (six) hours as needed for muscle spasms. 30 tablet Leath-Warren, Belen Bowers, NP      PDMP not reviewed this encounter.   Hardy Lia, NP 08/25/23 1933

## 2023-08-25 NOTE — ED Triage Notes (Signed)
 Pt reports pain in the left shoulder, shoot through the shoulder when taking a deep breath. Denies injury

## 2024-01-27 ENCOUNTER — Other Ambulatory Visit (HOSPITAL_BASED_OUTPATIENT_CLINIC_OR_DEPARTMENT_OTHER): Payer: Self-pay | Admitting: Family Medicine

## 2024-01-27 DIAGNOSIS — Z8249 Family history of ischemic heart disease and other diseases of the circulatory system: Secondary | ICD-10-CM

## 2024-03-01 ENCOUNTER — Other Ambulatory Visit (HOSPITAL_BASED_OUTPATIENT_CLINIC_OR_DEPARTMENT_OTHER)

## 2024-03-07 ENCOUNTER — Other Ambulatory Visit (HOSPITAL_BASED_OUTPATIENT_CLINIC_OR_DEPARTMENT_OTHER)

## 2024-03-09 ENCOUNTER — Inpatient Hospital Stay (HOSPITAL_BASED_OUTPATIENT_CLINIC_OR_DEPARTMENT_OTHER)
Admission: RE | Admit: 2024-03-09 | Discharge: 2024-03-09 | Payer: Self-pay | Attending: Family Medicine | Admitting: Family Medicine

## 2024-03-09 DIAGNOSIS — Z8249 Family history of ischemic heart disease and other diseases of the circulatory system: Secondary | ICD-10-CM

## 2024-04-12 NOTE — Progress Notes (Signed)
 " Cardiology Office Note:  .   Date:  04/15/2024  ID:  Bernard Lloyd, DOB 1975/08/11, MRN [id number] PCP: Samie Frederick, PA-C  [facility] Providers Cardiologist:  None   History of Present Illness: .    Chief Complaint  Patient presents with   coronary calcium    Parv Manthey is a 49 y.o. male with below history who presents for the evaluation of coronary calcium at the request of Samie Frederick, [place].   History of Present Illness   Viktor Philipp is a 49 year old male with hyperlipidemia who presents for evaluation of coronary calcium score.  He has a coronary calcium score of 145, placing him in the 95th percentile. He is currently on Lipitor 10 mg, and his LDL is 64 mg/dL.  In May, he experienced a single episode of chest discomfort following his father's funeral, which he attributes to stress. The discomfort was described as 'tinnitus' in his arm and left side of his chest. No further episodes have occurred since then. No current chest pain or trouble breathing.  He denies any history of high blood pressure or diabetes. 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 He does not smoke or use tobacco products and consumes alcohol occasionally. He maintains an active lifestyle and exercises regularly.  Family history is notable for heart issues in his uncles, who had stents and bypass surgeries, with one uncle dying of a heart attack in his 88s or 53s. His parents do not have a history of heart troubles.           Problem List HLD -T chol 127, TG 122, LDL 64, HDL 41 2. CAD -CAC 145 (93rd percentile)    ROS: All other ROS reviewed and negative. Pertinent positives noted in the HPI.     Studies Reviewed: SABRA   EKG Interpretation Date/Time:  Friday April 15 2024 15:27:05 EST Ventricular Rate:  73 PR Interval:  154 QRS Duration:  76 QT Interval:  332 QTC Calculation: 365 R Axis:   50  Text Interpretation: Normal sinus rhythm Normal ECG Confirmed by Barbaraann Kotyk  [id number]) on 04/15/2024 3:44:04 PM   CT CAC Score 03/15/2024 IMPRESSION: Coronary calcium score of 145. This was 93rd percentile for age-, race-, and sex-matched controls. Physical Exam:   VS:  BP 112/76   Pulse 73   Ht 6' (1.829 m)   Wt 194 lb (88 kg)   SpO2 95%   BMI 26.31 kg/m    Wt Readings from Last 3 Encounters:  04/15/24 194 lb (88 kg)  12/11/16 239 lb (108.4 kg)  11/12/16 239 lb (108.4 kg)    GEN: Well nourished, well developed in no acute distress NECK: No JVD; No carotid bruits CARDIAC: RRR, no murmurs, rubs, gallops RESPIRATORY:  Clear to auscultation without rales, wheezing or rhonchi  ABDOMEN: Soft, non-tender, non-distended EXTREMITIES:  No edema; No deformity  ASSESSMENT AND PLAN: .   Assessment and Plan    Coronary artery disease with elevated coronary calcium score Chest pain, possible cardiac  Coronary calcium score of 145 in the 95th percentile, stable calcified plaque in LAD and left main artery. Non-calcified plaque is a concern. Recommended coronary CTA with contrast to assess non-calcified plaque and arterial narrowing. - Ordered coronary CTA with contrast. - Obtained updated kidney profile via lab draw. - Administered 100 mg metoprolol  on scan day to slow heart rate. - Advised increased water intake on scan day. - Scheduled follow-up for scan results.  Mixed hyperlipidemia  LDL at goal with Lipitor 10 mg. No aspirin needed as non-calcified plaque risk is not high. - Continue Lipitor 10 mg. - Referred to dietitian for dietary counseling.                Follow-up: Return in about 1 year (around 04/15/2025).  Signed, Darryle DASEN. Barbaraann, MD, [facility]  [facility]  [address] [place], [place] [address] [phone]  4:17 PM   "

## 2024-04-15 ENCOUNTER — Encounter (HOSPITAL_BASED_OUTPATIENT_CLINIC_OR_DEPARTMENT_OTHER): Payer: Self-pay | Admitting: Cardiovascular Disease

## 2024-04-15 ENCOUNTER — Ambulatory Visit (HOSPITAL_BASED_OUTPATIENT_CLINIC_OR_DEPARTMENT_OTHER): Admitting: Cardiovascular Disease

## 2024-04-15 VITALS — BP 112/76 | HR 73 | Ht 72.0 in | Wt 194.0 lb

## 2024-04-15 DIAGNOSIS — R931 Abnormal findings on diagnostic imaging of heart and coronary circulation: Secondary | ICD-10-CM

## 2024-04-15 DIAGNOSIS — R072 Precordial pain: Secondary | ICD-10-CM | POA: Diagnosis not present

## 2024-04-15 DIAGNOSIS — Z8249 Family history of ischemic heart disease and other diseases of the circulatory system: Secondary | ICD-10-CM | POA: Diagnosis not present

## 2024-04-15 DIAGNOSIS — E782 Mixed hyperlipidemia: Secondary | ICD-10-CM

## 2024-04-15 MED ORDER — METOPROLOL TARTRATE 100 MG PO TABS: 100.0000 mg | ORAL_TABLET | Freq: Once | ORAL | 0 refills | Status: AC

## 2024-04-15 NOTE — Patient Instructions (Addendum)
 Medication Instructions:  No changes *If you need a refill on your cardiac medications before your next appointment, please call your pharmacy*  Lab Work: Today: bmet  Testing/Procedures: Coronary CT Angiogram  Follow-Up: At [facility], you and your health needs are our priority.  As part of our continuing mission to provide you with exceptional heart care, our providers are all part of one team.  This team includes your primary Cardiologist (physician) and Advanced Practice Providers or APPs (Physician Assistants and Nurse Practitioners) who all work together to provide you with the care you need, when you need it.  Your next appointment:   12 month(s)  Provider:   Rosaline Bane, NP or Reche Finder, NP    Other Instructions   Your cardiac CT will be scheduled at [facility] [address]  [place], [place] [address]  If scheduled at the Heart and Vascular Tower at [address], please enter the parking lot using the [address] entrance and use the FREE valet service at the patient drop-off area. Enter the building and check-in with registration on the main floor.  Please follow these instructions carefully (unless otherwise directed):  An IV will be required for this test and Nitroglycerin  will be given.  Hold all erectile dysfunction medications at least 3 days (72 hrs) prior to test. (Ie viagra, cialis, sildenafil, tadalafil, etc)   On the Night Before the Test: Be sure to Drink plenty of water. Do not consume any caffeinated/decaffeinated beverages or chocolate 12 hours prior to your test. Do not take any antihistamines 12 hours prior to your test.  On the Day of the Test: Drink plenty of water until 1 hour prior to the test. Do not eat any food 1 hour prior to test. You may take your regular medications prior to the test.  Take metoprolol  (Lopressor ) two hours prior to test. Patients who wear a continuous glucose  monitor MUST remove the device prior to scanning.      After the Test: Drink plenty of water. After receiving IV contrast, you may experience a mild flushed feeling. This is normal. On occasion, you may experience a mild rash up to 24 hours after the test. This is not dangerous. If this occurs, you can take Benadryl 25 mg, Zyrtec, Claritin, or Allegra and increase your fluid intake. (Patients taking Tikosyn should avoid Benadryl, and may take Zyrtec, Claritin, or Allegra) If you experience trouble breathing, this can be serious. If it is severe call 911 IMMEDIATELY. If it is mild, please call our office.  We will call to schedule your test 2-4 weeks out understanding that some insurance companies will need an authorization prior to the service being performed.   For more information and frequently asked questions, please visit our website : [email or web address]  For non-scheduling related questions, please contact the cardiac imaging nurse navigator should you have any questions/concerns: Cardiac Imaging Nurse Navigators Direct Office Dial: [phone]   For scheduling needs, including cancellations and rescheduling, please call Brittany, [phone].

## 2024-04-16 LAB — BASIC METABOLIC PANEL WITH GFR
BUN/Creatinine Ratio: 14 (ref 9–20)
BUN: 17 mg/dL (ref 6–24)
CO2: 23 mmol/L (ref 20–29)
Calcium: 9.6 mg/dL (ref 8.7–10.2)
Chloride: 104 mmol/L (ref 96–106)
Creatinine, Ser: 1.18 mg/dL (ref 0.76–1.27)
Glucose: 86 mg/dL (ref 70–99)
Potassium: 4.7 mmol/L (ref 3.5–5.2)
Sodium: 142 mmol/L (ref 134–144)
eGFR: 76 mL/min/1.73

## 2024-04-18 ENCOUNTER — Ambulatory Visit: Payer: Self-pay | Admitting: Cardiovascular Disease

## 2024-04-19 ENCOUNTER — Encounter (HOSPITAL_COMMUNITY): Payer: Self-pay

## 2024-04-20 ENCOUNTER — Ambulatory Visit (HOSPITAL_COMMUNITY)
Admission: RE | Admit: 2024-04-20 | Discharge: 2024-04-20 | Disposition: A | Discharge status: Home / Self Care | Source: Ambulatory Visit | Attending: Cardiovascular Disease | Admitting: Cardiovascular Disease

## 2024-04-20 DIAGNOSIS — R072 Precordial pain: Secondary | ICD-10-CM | POA: Insufficient documentation

## 2024-04-20 MED ORDER — IOHEXOL 350 MG/ML SOLN
100.0000 mL | Freq: Once | INTRAVENOUS | Status: AC | PRN
  Administered 2024-04-20: 100 mL via INTRAVENOUS

## 2024-04-20 MED ORDER — NITROGLYCERIN 0.4 MG SL SUBL
0.8000 mg | SUBLINGUAL_TABLET | Freq: Once | SUBLINGUAL | Status: AC
  Administered 2024-04-20: 0.8 mg via SUBLINGUAL
